# Patient Record
Sex: Female | Born: 1985 | Race: White | Hispanic: No | Marital: Single | Smoking: Never smoker
Health system: Southern US, Community
[De-identification: ages and names within clinical notes are randomized; demographics above are authoritative.]

## PROBLEM LIST (undated history)

## (undated) DIAGNOSIS — Z8719 Personal history of other diseases of the digestive system: Secondary | ICD-10-CM

## (undated) DIAGNOSIS — S83511A Sprain of anterior cruciate ligament of right knee, initial encounter: Secondary | ICD-10-CM

## (undated) DIAGNOSIS — Z9989 Dependence on other enabling machines and devices: Secondary | ICD-10-CM

## (undated) DIAGNOSIS — F419 Anxiety disorder, unspecified: Secondary | ICD-10-CM

## (undated) DIAGNOSIS — S83206A Unspecified tear of unspecified meniscus, current injury, right knee, initial encounter: Secondary | ICD-10-CM

## (undated) DIAGNOSIS — K589 Irritable bowel syndrome without diarrhea: Secondary | ICD-10-CM

## (undated) DIAGNOSIS — G4733 Obstructive sleep apnea (adult) (pediatric): Secondary | ICD-10-CM

## (undated) DIAGNOSIS — F32A Depression, unspecified: Secondary | ICD-10-CM

## (undated) DIAGNOSIS — F329 Major depressive disorder, single episode, unspecified: Secondary | ICD-10-CM

## (undated) DIAGNOSIS — E282 Polycystic ovarian syndrome: Secondary | ICD-10-CM

## (undated) DIAGNOSIS — Z87820 Personal history of traumatic brain injury: Secondary | ICD-10-CM

## (undated) HISTORY — DX: Dependence on other enabling machines and devices: Z99.89

## (undated) HISTORY — PX: ABDOMINAL HERNIA REPAIR: SHX539

## (undated) HISTORY — DX: Obstructive sleep apnea (adult) (pediatric): G47.33

## (undated) HISTORY — DX: Polycystic ovarian syndrome: E28.2

## (undated) HISTORY — DX: Irritable bowel syndrome, unspecified: K58.9

## (undated) HISTORY — DX: Personal history of traumatic brain injury: Z87.820

---
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
## 2006-04-05 ENCOUNTER — Emergency Department (HOSPITAL_COMMUNITY): Admission: EM | Admit: 2006-04-05 | Discharge: 2006-04-05 | Payer: Self-pay | Admitting: Emergency Medicine

## 2012-02-21 HISTORY — PX: ESOPHAGOGASTRODUODENOSCOPY: SHX1529

## 2013-06-23 ENCOUNTER — Other Ambulatory Visit: Payer: Self-pay | Admitting: Family Medicine

## 2013-06-23 DIAGNOSIS — M25561 Pain in right knee: Secondary | ICD-10-CM

## 2013-06-28 ENCOUNTER — Ambulatory Visit
Admission: RE | Admit: 2013-06-28 | Discharge: 2013-06-28 | Disposition: A | Discharge status: Home / Self Care | Payer: 59 | Source: Ambulatory Visit | Attending: Family Medicine | Admitting: Family Medicine

## 2013-06-28 DIAGNOSIS — M25561 Pain in right knee: Secondary | ICD-10-CM

## 2013-07-30 ENCOUNTER — Encounter (HOSPITAL_BASED_OUTPATIENT_CLINIC_OR_DEPARTMENT_OTHER): Payer: Self-pay | Admitting: *Deleted

## 2013-08-01 ENCOUNTER — Encounter (HOSPITAL_BASED_OUTPATIENT_CLINIC_OR_DEPARTMENT_OTHER): Payer: Self-pay | Admitting: *Deleted

## 2013-08-01 NOTE — Progress Notes (Signed)
NPO AFTER MN WITH EXCEPTION CLEAR LIQUIDS UNTIL 0800 (NO CREAM/ MILK PRODUCTS).  ARRIVE AT 1200. NEEDS HG AND URINE PREG. WILL TAKE AM MEDS AND IF NEEDED TAKE TYLENOL W/ SIPS OF WATER. 

## 2013-08-06 ENCOUNTER — Other Ambulatory Visit: Payer: Self-pay | Admitting: Orthopedic Surgery

## 2013-08-07 ENCOUNTER — Ambulatory Visit (HOSPITAL_BASED_OUTPATIENT_CLINIC_OR_DEPARTMENT_OTHER)
Admission: RE | Admit: 2013-08-07 | Discharge: 2013-08-07 | Disposition: A | Discharge status: Home / Self Care | Payer: 59 | Source: Ambulatory Visit | Attending: Specialist | Admitting: Specialist

## 2013-08-07 ENCOUNTER — Encounter (HOSPITAL_BASED_OUTPATIENT_CLINIC_OR_DEPARTMENT_OTHER)
Admission: RE | Disposition: A | Discharge status: Home / Self Care | Payer: Self-pay | Source: Ambulatory Visit | Attending: Specialist

## 2013-08-07 ENCOUNTER — Ambulatory Visit (HOSPITAL_BASED_OUTPATIENT_CLINIC_OR_DEPARTMENT_OTHER): Payer: 59 | Admitting: Anesthesiology

## 2013-08-07 ENCOUNTER — Encounter (HOSPITAL_BASED_OUTPATIENT_CLINIC_OR_DEPARTMENT_OTHER): Payer: 59 | Admitting: Anesthesiology

## 2013-08-07 ENCOUNTER — Encounter (HOSPITAL_BASED_OUTPATIENT_CLINIC_OR_DEPARTMENT_OTHER): Payer: Self-pay | Admitting: *Deleted

## 2013-08-07 DIAGNOSIS — Z9889 Other specified postprocedural states: Secondary | ICD-10-CM

## 2013-08-07 DIAGNOSIS — X58XXXA Exposure to other specified factors, initial encounter: Secondary | ICD-10-CM | POA: Insufficient documentation

## 2013-08-07 DIAGNOSIS — Y9379 Activity, other specified sports and athletics: Secondary | ICD-10-CM | POA: Insufficient documentation

## 2013-08-07 DIAGNOSIS — Z880 Allergy status to penicillin: Secondary | ICD-10-CM | POA: Insufficient documentation

## 2013-08-07 DIAGNOSIS — Z888 Allergy status to other drugs, medicaments and biological substances status: Secondary | ICD-10-CM | POA: Insufficient documentation

## 2013-08-07 DIAGNOSIS — S83289A Other tear of lateral meniscus, current injury, unspecified knee, initial encounter: Secondary | ICD-10-CM | POA: Insufficient documentation

## 2013-08-07 DIAGNOSIS — F3289 Other specified depressive episodes: Secondary | ICD-10-CM | POA: Insufficient documentation

## 2013-08-07 DIAGNOSIS — Z79899 Other long term (current) drug therapy: Secondary | ICD-10-CM | POA: Insufficient documentation

## 2013-08-07 DIAGNOSIS — S83509A Sprain of unspecified cruciate ligament of unspecified knee, initial encounter: Secondary | ICD-10-CM | POA: Insufficient documentation

## 2013-08-07 DIAGNOSIS — F411 Generalized anxiety disorder: Secondary | ICD-10-CM | POA: Insufficient documentation

## 2013-08-07 DIAGNOSIS — F329 Major depressive disorder, single episode, unspecified: Secondary | ICD-10-CM | POA: Insufficient documentation

## 2013-08-07 HISTORY — DX: Depression, unspecified: F32.A

## 2013-08-07 HISTORY — DX: Anxiety disorder, unspecified: F41.9

## 2013-08-07 HISTORY — DX: Major depressive disorder, single episode, unspecified: F32.9

## 2013-08-07 HISTORY — DX: Personal history of other diseases of the digestive system: Z87.19

## 2013-08-07 HISTORY — DX: Unspecified tear of unspecified meniscus, current injury, right knee, initial encounter: S83.206A

## 2013-08-07 HISTORY — PX: KNEE ARTHROSCOPY WITH ANTERIOR CRUCIATE LIGAMENT (ACL) REPAIR WITH HAMSTRING GRAFT: SHX5645

## 2013-08-07 HISTORY — DX: Sprain of anterior cruciate ligament of right knee, initial encounter: S83.511A

## 2013-08-07 LAB — POCT PREGNANCY, URINE: PREG TEST UR: NEGATIVE

## 2013-08-07 LAB — POCT HEMOGLOBIN-HEMACUE: Hemoglobin: 12.1 g/dL (ref 12.0–15.0)

## 2013-08-07 SURGERY — KNEE ARTHROSCOPY WITH ANTERIOR CRUCIATE LIGAMENT (ACL) REPAIR WITH HAMSTRING GRAFT
Anesthesia: Regional | Site: Knee | Laterality: Right

## 2013-08-07 MED ORDER — ONDANSETRON HCL 4 MG/2ML IJ SOLN
INTRAMUSCULAR | Status: DC | PRN
  Administered 2013-08-07: 4 mg via INTRAVENOUS

## 2013-08-07 MED ORDER — LACTATED RINGERS IV SOLN
INTRAVENOUS | Status: DC | PRN
  Administered 2013-08-07 (×2): via INTRAVENOUS

## 2013-08-07 MED ORDER — ASPIRIN EC 325 MG PO TBEC: 325.0000 mg | DELAYED_RELEASE_TABLET | Freq: Two times a day (BID) | ORAL | Status: DC

## 2013-08-07 MED ORDER — LACTATED RINGERS IV SOLN
INTRAVENOUS | Status: DC
  Administered 2013-08-07 (×2): via INTRAVENOUS
  Filled 2013-08-07: qty 1000

## 2013-08-07 MED ORDER — OXYCODONE HCL 5 MG PO TABS
5.0000 mg | ORAL_TABLET | Freq: Once | ORAL | Status: AC | PRN
  Administered 2013-08-07: 5 mg via ORAL
  Filled 2013-08-07: qty 1

## 2013-08-07 MED ORDER — METHOCARBAMOL 500 MG PO TABS: 500.0000 mg | ORAL_TABLET | Freq: Four times a day (QID) | ORAL | Status: DC | PRN

## 2013-08-07 MED ORDER — PROPOFOL 10 MG/ML IV BOLUS
INTRAVENOUS | Status: DC | PRN
  Administered 2013-08-07: 200 mg via INTRAVENOUS

## 2013-08-07 MED ORDER — FENTANYL CITRATE 0.05 MG/ML IJ SOLN
100.0000 ug | Freq: Once | INTRAMUSCULAR | Status: AC
  Administered 2013-08-07: 100 ug via INTRAVENOUS
  Filled 2013-08-07: qty 2

## 2013-08-07 MED ORDER — MIDAZOLAM HCL 2 MG/2ML IJ SOLN
2.0000 mg | Freq: Once | INTRAMUSCULAR | Status: AC
  Administered 2013-08-07: 2 mg via INTRAVENOUS
  Filled 2013-08-07: qty 2

## 2013-08-07 MED ORDER — HYDROMORPHONE HCL PF 1 MG/ML IJ SOLN
0.2500 mg | INTRAMUSCULAR | Status: DC | PRN
  Filled 2013-08-07: qty 1

## 2013-08-07 MED ORDER — DEXAMETHASONE SODIUM PHOSPHATE 4 MG/ML IJ SOLN
INTRAMUSCULAR | Status: DC | PRN
  Administered 2013-08-07: 10 mg via INTRAVENOUS

## 2013-08-07 MED ORDER — METOCLOPRAMIDE HCL 5 MG/ML IJ SOLN
INTRAMUSCULAR | Status: DC | PRN
  Administered 2013-08-07: 10 mg via INTRAVENOUS

## 2013-08-07 MED ORDER — SODIUM CHLORIDE 0.9 % IR SOLN
Status: DC | PRN
  Administered 2013-08-07: 18000 mL

## 2013-08-07 MED ORDER — FENTANYL CITRATE 0.05 MG/ML IJ SOLN
INTRAMUSCULAR | Status: DC | PRN
  Administered 2013-08-07 (×12): 25 ug via INTRAVENOUS

## 2013-08-07 MED ORDER — MIDAZOLAM HCL 2 MG/2ML IJ SOLN
INTRAMUSCULAR | Status: AC
  Filled 2013-08-07: qty 2

## 2013-08-07 MED ORDER — KETOROLAC TROMETHAMINE 30 MG/ML IJ SOLN
INTRAMUSCULAR | Status: DC | PRN
  Administered 2013-08-07: 30 mg via INTRAVENOUS

## 2013-08-07 MED ORDER — OXYCODONE HCL 5 MG/5ML PO SOLN
5.0000 mg | Freq: Once | ORAL | Status: AC | PRN
  Filled 2013-08-07: qty 5

## 2013-08-07 MED ORDER — MORPHINE SULFATE 4 MG/ML IJ SOLN
INTRAMUSCULAR | Status: DC | PRN
  Administered 2013-08-07: 4 mg via INTRAVENOUS

## 2013-08-07 MED ORDER — LIDOCAINE HCL (CARDIAC) 20 MG/ML IV SOLN
INTRAVENOUS | Status: DC | PRN
  Administered 2013-08-07: 60 mg via INTRAVENOUS

## 2013-08-07 MED ORDER — CEFAZOLIN SODIUM-DEXTROSE 2-3 GM-% IV SOLR
2.0000 g | INTRAVENOUS | Status: AC
  Administered 2013-08-07: 2 g via INTRAVENOUS
  Filled 2013-08-07: qty 50

## 2013-08-07 MED ORDER — PROMETHAZINE HCL 25 MG/ML IJ SOLN
INTRAMUSCULAR | Status: AC
  Filled 2013-08-07: qty 1

## 2013-08-07 MED ORDER — ONDANSETRON HCL 4 MG/2ML IJ SOLN: INTRAMUSCULAR | Status: DC | PRN

## 2013-08-07 MED ORDER — BUPIVACAINE HCL (PF) 0.25 % IJ SOLN
INTRAMUSCULAR | Status: DC | PRN
  Administered 2013-08-07: 30 mL

## 2013-08-07 MED ORDER — FENTANYL CITRATE 0.05 MG/ML IJ SOLN
INTRAMUSCULAR | Status: AC
  Filled 2013-08-07: qty 2

## 2013-08-07 MED ORDER — MIDAZOLAM HCL 5 MG/5ML IJ SOLN
INTRAMUSCULAR | Status: DC | PRN
  Administered 2013-08-07: 0.5 mg via INTRAVENOUS
  Administered 2013-08-07: 1 mg via INTRAVENOUS
  Administered 2013-08-07: 0.5 mg via INTRAVENOUS

## 2013-08-07 MED ORDER — SODIUM CHLORIDE 0.9 % IV SOLN
INTRAVENOUS | Status: DC
Start: 2013-08-07 — End: 2013-08-07
  Filled 2013-08-07: qty 1000

## 2013-08-07 MED ORDER — ACETAMINOPHEN 10 MG/ML IV SOLN
INTRAVENOUS | Status: DC | PRN
  Administered 2013-08-07: 1000 mg via INTRAVENOUS

## 2013-08-07 MED ORDER — POVIDONE-IODINE 7.5 % EX SOLN
Freq: Once | CUTANEOUS | Status: DC
  Filled 2013-08-07: qty 118

## 2013-08-07 MED ORDER — OXYCODONE HCL 5 MG PO TABS
ORAL_TABLET | ORAL | Status: AC
  Filled 2013-08-07: qty 1

## 2013-08-07 MED ORDER — PROMETHAZINE HCL 25 MG/ML IJ SOLN
6.2500 mg | INTRAMUSCULAR | Status: DC | PRN
  Administered 2013-08-07: 6.25 mg via INTRAVENOUS
  Filled 2013-08-07: qty 1

## 2013-08-07 MED ORDER — FENTANYL CITRATE 0.05 MG/ML IJ SOLN
INTRAMUSCULAR | Status: AC
  Filled 2013-08-07: qty 6

## 2013-08-07 MED ORDER — CEPHALEXIN 500 MG PO CAPS: 500.0000 mg | ORAL_CAPSULE | Freq: Three times a day (TID) | ORAL | Status: DC

## 2013-08-07 MED ORDER — OXYCODONE-ACETAMINOPHEN 5-325 MG PO TABS: 1.0000 | ORAL_TABLET | ORAL | Status: DC | PRN

## 2013-08-07 MED ORDER — MEPERIDINE HCL 25 MG/ML IJ SOLN
6.2500 mg | INTRAMUSCULAR | Status: DC | PRN
  Filled 2013-08-07: qty 1

## 2013-08-07 MED ORDER — MORPHINE SULFATE 4 MG/ML IJ SOLN
INTRAMUSCULAR | Status: AC
  Filled 2013-08-07: qty 1

## 2013-08-07 SURGICAL SUPPLY — 86 items
ANCHOR BUTTON TIGHTROPE ACL RT (Orthopedic Implant) ×3 IMPLANT
ANCHOR PUSHLOCK BIOCOMP 3.5X19 (Orthopedic Implant) ×3 IMPLANT
BANDAGE ESMARK 6X9 LF (GAUZE/BANDAGES/DRESSINGS) ×1 IMPLANT
BANDAGE GAUZE ELAST BULKY 4 IN (GAUZE/BANDAGES/DRESSINGS) ×3 IMPLANT
BLADE 4.2CUDA (BLADE) IMPLANT
BLADE CUDA 5.5 (BLADE) ×3 IMPLANT
BLADE CUDA GRT WHITE 3.5 (BLADE) ×3 IMPLANT
BLADE SURG 10 STRL SS (BLADE) ×3 IMPLANT
BLADE SURG 15 STRL LF DISP TIS (BLADE) ×1 IMPLANT
BLADE SURG 15 STRL SS (BLADE) ×2
BNDG CMPR 9X6 STRL LF SNTH (GAUZE/BANDAGES/DRESSINGS) ×1
BNDG ESMARK 6X9 LF (GAUZE/BANDAGES/DRESSINGS) ×3
BNDG GAUZE ELAST 4 BULKY (GAUZE/BANDAGES/DRESSINGS) ×6 IMPLANT
BUR OVAL 6.0 (BURR) IMPLANT
BUR VERTEX HOODED 4.5 (BURR) ×3 IMPLANT
CANISTER SUCT LVC 12 LTR MEDI- (MISCELLANEOUS) ×6 IMPLANT
CANISTER SUCTION 1200CC (MISCELLANEOUS) IMPLANT
CANISTER SUCTION 2500CC (MISCELLANEOUS) ×6 IMPLANT
CLOSURE WOUND 1/2 X4 (GAUZE/BANDAGES/DRESSINGS) ×1
CLOTH BEACON ORANGE TIMEOUT ST (SAFETY) IMPLANT
COVER TABLE BACK 60X90 (DRAPES) ×3 IMPLANT
CUFF TOURN SGL QUICK 34 (TOURNIQUET CUFF) ×2
CUFF TRNQT CYL 34X4X40X1 (TOURNIQUET CUFF) ×1 IMPLANT
DRAPE ARTHROSCOPY W/POUCH 114 (DRAPES) ×3 IMPLANT
DRAPE INCISE IOBAN 66X45 STRL (DRAPES) ×3 IMPLANT
DRAPE LG THREE QUARTER DISP (DRAPES) ×9 IMPLANT
DRAPE OEC MINIVIEW 54X84 (DRAPES) ×3 IMPLANT
DRAPE U-SHAPE 47X51 STRL (DRAPES) ×3 IMPLANT
DRILL FLIPCUTTER II 8.5MM (INSTRUMENTS) ×1 IMPLANT
DRILL FLIPCUTTER II 9.0MM (INSTRUMENTS) ×1 IMPLANT
DURAPREP 26ML APPLICATOR (WOUND CARE) ×3 IMPLANT
ELECT REM PT RETURN 9FT ADLT (ELECTROSURGICAL) ×3
ELECTRODE REM PT RTRN 9FT ADLT (ELECTROSURGICAL) ×1 IMPLANT
FIBERSTICK 2 (SUTURE) ×3 IMPLANT
FLIPCUTTER II 8.5MM (INSTRUMENTS) ×3
FLIPCUTTER II 9.0MM (INSTRUMENTS) ×3
GAUZE XEROFORM 1X8 LF (GAUZE/BANDAGES/DRESSINGS) ×3 IMPLANT
GLOVE BIO SURGEON STRL SZ7.5 (GLOVE) ×3 IMPLANT
GLOVE BIOGEL PI IND STRL 7.5 (GLOVE) ×2 IMPLANT
GLOVE BIOGEL PI INDICATOR 7.5 (GLOVE) ×4
GLOVE INDICATOR 8.0 STRL GRN (GLOVE) ×3 IMPLANT
GLOVE SURG ORTHO 8.0 STRL STRW (GLOVE) ×3 IMPLANT
GLOVE SURG SS PI 7.5 STRL IVOR (GLOVE) ×3 IMPLANT
GOWN PREVENTION PLUS LG XLONG (DISPOSABLE) IMPLANT
GOWN STRL REIN XL XLG (GOWN DISPOSABLE) IMPLANT
GOWN STRL REUS W/TWL XL LVL3 (GOWN DISPOSABLE) ×9 IMPLANT
IMMOBILIZER KNEE 22 UNIV (SOFTGOODS) IMPLANT
IV NS IRRIG 3000ML ARTHROMATIC (IV SOLUTION) ×18 IMPLANT
KIT BUTTON TIGHTROPE ABS 8X12 (Anchor) ×3 IMPLANT
KIT RETRO BUTTON TIGHTROPE ABS (Anchor) ×3 IMPLANT
KIT TRANSTIBIAL (DISPOSABLE) IMPLANT
KNEE WRAP E Z 3 GEL PACK (MISCELLANEOUS) ×3 IMPLANT
MINI VAC (SURGICAL WAND) IMPLANT
NEEDLE HYPO 22GX1.5 SAFETY (NEEDLE) IMPLANT
PACK ARTHROSCOPY DSU (CUSTOM PROCEDURE TRAY) ×3 IMPLANT
PACK BASIN DAY SURGERY FS (CUSTOM PROCEDURE TRAY) ×3 IMPLANT
PAD ABD 8X10 STRL (GAUZE/BANDAGES/DRESSINGS) ×3 IMPLANT
PADDING CAST ABS 4INX4YD NS (CAST SUPPLIES) ×2
PADDING CAST ABS COTTON 4X4 ST (CAST SUPPLIES) ×1 IMPLANT
PENCIL BUTTON HOLSTER BLD 10FT (ELECTRODE) ×3 IMPLANT
SET ARTHROSCOPY TUBING (MISCELLANEOUS) ×2
SET ARTHROSCOPY TUBING LN (MISCELLANEOUS) ×1 IMPLANT
SET PAD KNEE POSITIONER (MISCELLANEOUS) ×3 IMPLANT
SPONGE GAUZE 4X4 12PLY (GAUZE/BANDAGES/DRESSINGS) ×3 IMPLANT
SPONGE LAP 4X18 X RAY DECT (DISPOSABLE) ×3 IMPLANT
STRIP CLOSURE SKIN 1/2X4 (GAUZE/BANDAGES/DRESSINGS) ×2 IMPLANT
SUCTION FRAZIER TIP 10 FR DISP (SUCTIONS) ×3 IMPLANT
SUT 2 FIBERLOOP 20 STRT BLUE (SUTURE) ×6
SUT ETHILON 4 0 PS 2 18 (SUTURE) ×6 IMPLANT
SUT FIBERWIRE #2 38 T-5 BLUE (SUTURE)
SUT MNCRL AB 3-0 PS2 18 (SUTURE) IMPLANT
SUT VIC AB 0 CT1 36 (SUTURE) IMPLANT
SUT VIC AB 0 CT2 27 (SUTURE) ×9 IMPLANT
SUT VIC AB 2-0 CT1 27 (SUTURE) ×2
SUT VIC AB 2-0 CT1 TAPERPNT 27 (SUTURE) ×1 IMPLANT
SUTURE 2 FIBERLOOP 20 STRT BLU (SUTURE) ×2 IMPLANT
SUTURE FIBERWR #2 38 T-5 BLUE (SUTURE) IMPLANT
SUTURE TIGERSTICK 2 TIGERWIR 2 (MISCELLANEOUS) IMPLANT
SYR CONTROL 10ML LL (SYRINGE) IMPLANT
TIGERSTICK 2 TIGERWIRE 2 (MISCELLANEOUS)
TOWEL OR 17X24 6PK STRL BLUE (TOWEL DISPOSABLE) ×3 IMPLANT
TUBE CONNECTING 12'X1/4 (SUCTIONS) ×2
TUBE CONNECTING 12X1/4 (SUCTIONS) ×4 IMPLANT
WAND 30 DEG SABER W/CORD (SURGICAL WAND) IMPLANT
WAND 90 DEG TURBOVAC W/CORD (SURGICAL WAND) IMPLANT
WATER STERILE IRR 500ML POUR (IV SOLUTION) ×3 IMPLANT

## 2013-08-07 NOTE — Discharge Instructions (Signed)
°  Post Anesthesia Home Care Instructions ° °Activity: °Get plenty of rest for the remainder of the day. A responsible adult should stay with you for 24 hours following the procedure.  °For the next 24 hours, DO NOT: °-Drive a car °-Operate machinery °-Drink alcoholic beverages °-Take any medication unless instructed by your physician °-Make any legal decisions or sign important papers. ° °Meals: °Start with liquid foods such as gelatin or soup. Progress to regular foods as tolerated. Avoid greasy, spicy, heavy foods. If nausea and/or vomiting occur, drink only clear liquids until the nausea and/or vomiting subsides. Call your physician if vomiting continues. ° °Special Instructions/Symptoms: °Your throat may feel dry or sore from the anesthesia or the breathing tube placed in your throat during surgery. If this causes discomfort, gargle with warm salt water. The discomfort should disappear within 24 hours. ° °Regional Anesthesia Blocks ° °1. Numbness or the inability to move the "blocked" extremity may last from 3-48 hours after placement. The length of time depends on the medication injected and your individual response to the medication. If the numbness is not going away after 48 hours, call your surgeon. ° °2. The extremity that is blocked will need to be protected until the numbness is gone and the  Strength has returned. Because you cannot feel it, you will need to take extra care to avoid injury. Because it may be weak, you may have difficulty moving it or using it. You may not know what position it is in without looking at it while the block is in effect. ° °3. For blocks in the legs and feet, returning to weight bearing and walking needs to be done carefully. You will need to wait until the numbness is entirely gone and the strength has returned. You should be able to move your leg and foot normally before you try and bear weight or walk. You will need someone to be with you when you first try to ensure you  do not fall and possibly risk injury. ° °4. Bruising and tenderness at the needle site are common side effects and will resolve in a few days. ° °5. Persistent numbness or new problems with movement should be communicated to the surgeon or the [facility] ([phone])/  Surgery Center ([phone]). °

## 2013-08-07 NOTE — H&P (Signed)
[name] LaityJacqueline K Downs is an 28 y.o. female.   Chief Complaint: "Right knee instability and pain for a few months"  HPI: Patient presents with right knee discomfort that had been persistent for several months now. This is related to a kickball incident. Despite conservative treatments, her discomfort has not improved. Imaging was obtained. Other conservative and surgical treatments were discussed in detail. Patient wishes to proceed with surgery as consented. Denies SOB, CP, or calf pain. No Fever, chills, or nausea/ vomiting.   Past Medical History  Diagnosis Date  . Right knee meniscal tear   . Right ACL tear   . H/O hiatal hernia   . Anxiety   . Depression     Past Surgical History  Procedure Laterality Date  . Abdominal hernia repair  age 86  . Esophagogastroduodenoscopy  2014    History reviewed. No pertinent family history. Social History:  reports that she has never smoked. She has never used smokeless tobacco. She reports that she drinks alcohol. She reports that she does not use illicit drugs.  Allergies:  Allergies  Allergen Reactions  . Amoxicillin Hives  . Neosporin [Neomycin-Bacitracin Zn-Polymyx] Other (See Comments)    Blistering reaction  . Penicillins Hives    Medications Prior to Admission  Medication Sig Dispense Refill  . cetirizine (ZYRTEC) 10 MG tablet Take 10 mg by mouth every morning.      Marland Kitchen. ibuprofen (ADVIL,MOTRIN) 200 MG tablet Take 200 mg by mouth every 6 (six) hours as needed.      . Norgestimate-Ethinyl Estradiol Triphasic (ORTHO TRI-CYCLEN LO) 0.18/0.215/0.25 MG-25 MCG tab Take 1 tablet by mouth every morning.      . sertraline (ZOLOFT) 50 MG tablet Take 50 mg by mouth every morning.      Marland Kitchen. acetaminophen (TYLENOL) 500 MG tablet Take 500 mg by mouth every 6 (six) hours as needed.        Results for orders placed during the hospital encounter of 08/07/13 (from the past 48 hour(s))  POCT HEMOGLOBIN-HEMACUE     Status: None   Collection Time   08/07/13 11:51 AM      Result Value Ref Range   Hemoglobin 12.1  12.0 - 15.0 g/dL  POCT PREGNANCY, URINE     Status: None   Collection Time    08/07/13 11:51 AM      Result Value Ref Range   Preg Test, Ur NEGATIVE  NEGATIVE   Comment:            THE SENSITIVITY OF THIS     METHODOLOGY IS >24 mIU/mL   No results found.  Review of Systems  Constitutional: Negative.   HENT: Negative.   Eyes: Negative.   Respiratory: Negative.   Cardiovascular: Negative.   Gastrointestinal: Negative.   Genitourinary: Negative.   Musculoskeletal: Positive for joint pain.  Skin: Negative.   Neurological: Negative.   Endo/Heme/Allergies: Negative.   Psychiatric/Behavioral: Negative.     Blood pressure 111/70, pulse 86, temperature 97.1 F (36.2 C), temperature source Oral, resp. rate 16, height 5' 7.75" (1.721 m), weight 79.379 kg (175 lb), last menstrual period 07/26/2013, SpO2 97.00%. Physical Exam  Constitutional: She is oriented to person, place, and time. She appears well-developed.  HENT:  Head: Normocephalic.  Eyes: EOM are normal.  Neck: Normal range of motion.  Cardiovascular: Normal rate, regular rhythm, normal heart sounds and intact distal pulses.   Respiratory: Effort normal and breath sounds normal.  GI: Soft. Bowel sounds are normal.  Genitourinary:  deferred  Musculoskeletal: She exhibits edema and tenderness.  Right knee. Calf soft and non tender.  Neurological: She is alert and oriented to person, place, and time.  Skin: Skin is warm and dry.  Psychiatric: Her behavior is normal.     Assessment/Plan: Torn ACL and medial/lateral meniscus: Right knee ACL reconstruction autograft and menisectomies. D/c home today Follow Instructions F/U in office in 3-4 days Take medication as directed   STILWELL, BRYSON L 08/07/2013, 1:35 PM

## 2013-08-07 NOTE — Op Note (Signed)
[phone]#[id number]

## 2013-08-07 NOTE — Anesthesia Preprocedure Evaluation (Addendum)
Anesthesia Evaluation  Patient identified by MRN, date of birth, ID band Patient awake    Reviewed: Allergy & Precautions, H&P , NPO status , Patient's Chart, lab work & pertinent test results  Airway Mallampati: I TM Distance: >3 FB Neck ROM: Full    Dental  (+) Dental Advisory Given   Pulmonary neg pulmonary ROS,  breath sounds clear to auscultation        Cardiovascular negative cardio ROS  Rhythm:Regular Rate:Normal     Neuro/Psych negative neurological ROS  negative psych ROS   GI/Hepatic Neg liver ROS, hiatal hernia,   Endo/Other  negative endocrine ROS  Renal/GU negative Renal ROS     Musculoskeletal negative musculoskeletal ROS (+)   Abdominal   Peds  Hematology negative hematology ROS (+)   Anesthesia Other Findings   Reproductive/Obstetrics negative OB ROS                          Anesthesia Physical Anesthesia Plan  ASA: I  Anesthesia Plan: General and Regional   Post-op Pain Management:    Induction: Intravenous  Airway Management Planned: LMA  Additional Equipment:   Intra-op Plan:   Post-operative Plan: Extubation in OR  Informed Consent: I have reviewed the patients History and Physical, chart, labs and discussed the procedure including the risks, benefits and alternatives for the proposed anesthesia with the patient or authorized representative who has indicated his/her understanding and acceptance.   Dental advisory given  Plan Discussed with: CRNA  Anesthesia Plan Comments:         Anesthesia Quick Evaluation

## 2013-08-07 NOTE — Transfer of Care (Signed)
Immediate Anesthesia Transfer of Care Note  Patient: [name] Downs  Procedure(s) Performed: Procedure(s) (LRB): RIGHT ARTHROSCOPY KNEE,AUTOGRAPH HAMSTRING ACL RECONSTRUCTION  (Right)  Patient Location: PACU  Anesthesia Type: General  Level of Consciousness: awake, sedated, patient cooperative and responds to stimulation  Airway & Oxygen Therapy: Patient Spontanous Breathing and Patient connected to face mask oxygen  Post-op Assessment: Report given to PACU RN, Post -op Vital signs reviewed and stable and Patient moving all extremities  Post vital signs: Reviewed and stable  Complications: No apparent anesthesia complications

## 2013-08-07 NOTE — Anesthesia Procedure Notes (Addendum)
Procedure Name: LMA Insertion Date/Time: 08/07/2013 2:23 PM Performed by: Jessica PriestBEESON, LYNN C Pre-anesthesia Checklist: Patient identified, Emergency Drugs available, Suction available and Patient being monitored Patient Re-evaluated:Patient Re-evaluated prior to inductionOxygen Delivery Method: Circle System Utilized Preoxygenation: Pre-oxygenation with 100% oxygen Intubation Type: IV induction Ventilation: Mask ventilation without difficulty LMA: LMA inserted LMA Size: 4.0 Number of attempts: 1 Airway Equipment and Method: bite block Placement Confirmation: positive ETCO2 Tube secured with: Tape Dental Injury: Teeth and Oropharynx as per pre-operative assessment    Anesthesia Regional Block:  Femoral nerve block  Pre-Anesthetic Checklist: ,, timeout performed, Correct Patient, Correct Site, Correct Laterality, Correct Procedure, Correct Position, site marked, Risks and benefits discussed,  Surgical consent,  Pre-op evaluation,  At surgeon's request and post-op pain management  Laterality: Right  Prep: Betadine       Needles:  Injection technique: Single-shot  Needle Type: Stimulator Needle - 80      Needle Gauge: 22 and 22 G  Needle insertion depth: 6 cm   Additional Needles:  Procedures: ultrasound guided (picture in chart) and nerve stimulator Femoral nerve block  Nerve Stimulator or Paresthesia:  Response: Twitch elicited, 0.6 mA,   Additional Responses:   Narrative:   Performed by: Personally  Anesthesiologist: [place], MD  Additional Notes: BP cuff, EKG monitors applied. Sedation begun. Femoral artery palpated for location of nerve. After nerve location via u/s anesthetic injected incrementally, slowly , and after neg aspirations. Tolerated well.

## 2013-08-07 NOTE — Interval H&P Note (Signed)
History and Physical Interval Note:  08/07/2013 2:06 PM  [name] Downs  has presented today for surgery, with the diagnosis of RIGHT KNEE ACL TEAR,MEDIAL AND LATERAL MENISCUS TEAR AND LOOSE BODY  The various methods of treatment have been discussed with the patient and family. After consideration of risks, benefits and other options for treatment, the patient has consented to  Procedure(s): RIGHT ARTHROSCOPY KNEE,AUTOGRAPH HAMSTRING ACL RECONSTRUCTION ,PARTIAL AND MEDIAL LATERAL MENISECTOMY,POSSIBLE LOOSE BODY REMOVAL (Right) as a surgical intervention .  The patient's history has been reviewed, patient examined, no change in status, stable for surgery.  I have reviewed the patient's chart and labs.  Questions were answered to the patient's satisfaction.     COLLINS,ROBERT ANDREW

## 2013-08-07 NOTE — H&P (View-Only) (Signed)
NPO AFTER MN WITH EXCEPTION CLEAR LIQUIDS UNTIL 0800 (NO CREAM/ MILK PRODUCTS).  ARRIVE AT 1200. NEEDS HG AND URINE PREG. WILL TAKE AM MEDS AND IF NEEDED TAKE TYLENOL W/ SIPS OF WATER.

## 2013-08-08 NOTE — Op Note (Signed)
[name], [name]         ACCOUNT NO.:  [id number]  MEDICAL RECORD NO.:  [id number]  LOCATION:                                 FACILITY:  PHYSICIAN:  Erasmo Leventhalobert Andrew Collins, M.D.DATE OF BIRTH:  [birth year]  DATE OF PROCEDURE:  08/07/2013 DATE OF DISCHARGE:                              OPERATIVE REPORT   PREOPERATIVE DIAGNOSES:  Right knee torn anterior cruciate ligament, possible torn medial meniscus, possible torn lateral meniscus, possible loose body.  POSTOPERATIVE DIAGNOSES: 1. Right knee complete rupture of the anterior cruciate ligament. 2. Complex tear of lateral meniscus.  PROCEDURES: 1. Right knee arthroscopic-assisted autograft hamstring anterior     cruciate ligament reconstruction. 2. Partial lateral meniscectomy.  SURGEON:  Erasmo Leventhalobert Andrew Collins, M.D.  ASSISTANT:  Arsenio LoaderBryson Stilwell, PA-C.  ANESTHESIA:  Regional block with general.  ESTIMATED BLOOD LOSS:  Minimal.  DRAINS:  None.  COMPLICATIONS:  None.  TOURNIQUET TIME:  85 minutes at 250 mmHg.  DISPOSITION:  PACU, stable.  OPERATIVE DETAILS:  The patient was counseled in the holding area and the correct site was identified, signed and marked appropriately.  IV was started and the regional block anesthesia was administered per the anesthesiologist.  TED hose applied on uninvolved leg.  On the way to the operating room, IV Ancef was given.  In the OR, placed in supine position under general anesthesia.  __________ properly padded and bumped.  Right lower extremity was elevated, prepped with DuraPrep, and draped in sterile fashion.  Time-out was done and confirmed by all. Exsanguinated with an Esmarch.  Tourniquet was inflated to 250 mmHg. The knee had been examined.  Positive Lachman's, anterior drawer, and pivot shift.  PCL __________ appeared to be stable.  Incision was made anteromedial __________ the skin, subcutaneous tissue, sartorius fascia was opened __________ were identified.  I then  separated the semitendinosus from the __________, leaving it attached and harvested the semitendinosus in standard fashion, protecting neurovascular structures including saphenous nerve.  Sartorius fascia was closed with Vicryl suture.  The graft was taken to the back table __________.  Arthroscopic portals were established posteromedial, inferomedial, inferolateral.  Diagnostic arthroscopy taken.  ACL was completely ruptured __________ PCL.  These were debrided and arthroplasty performed.  Overtop position was confirmed.  Suprapatellar pouch, medial and lateral gutters unremarkable.  __________ normal tracking throughout entire procedure.  Loose bodies were searched for, but none were encountered.  Lateral side was inspected.  There was a complex tear in the posterior and lateral meniscus.  Large pedunculated flap was debrided with motorized shavers and baskets __________ nicely. __________ meniscus was intact.  Medial side was inspected __________ was normal as was on the lateral side.  The medial meniscus was probed, found to be intact.  The knee was flexed to 90 degrees.  The Arthrex femoral guide __________ overtop position.  Small stab wound was made laterally and then Arthrex flip cutter was placed in anatomic ACL footprint.  A retro socket was performed.  FiberWire suture was passed.  The soft tissue debris was removed.  The tunnel was rasped.  On the tibial side, the guide was flipped into anatomic __________ insertion of tibia.  A flip cutter was engaged also in this region.  The retro socket was performed, FiberWire suture was passed.  Debris was removed from both areas, both tunnels were nice and clean.  __________ ACL TightRope system on the femoral side.  The grafts were delivered in a retrograde fashion, and I confirmed the button was in an overtop position in the periosteal cortex, both arthroscopically visual, palpable __________ anterolaterally, confirmed this  __________ the periosteum.  The grafts were noted to be delivered to the knee joint.  The tibia was placed in an antegrade fashion.  __________ degrees flexion, neutral rotation __________ both sides.  The grafts were sequentially locked down and nicely tensioned and it was supplemented distally with a PushLock anchor.  Knee was put through range of motion with excellent orientation of the graft.  Knee was clinically stable.  Graft had excellent orientation and fixation and well fixed on both sides.  C-arm confirmed that the button was in the __________ periosteal cortex of the femur. The joint was irrigated and an arthroscopic equipment was removed.  The portals were closed with 4-0 nylon suture.  __________ incision laterally closed with 5-0 nylon.  Anteromedial incision was closed with subcu Vicryl and the skin with a subcuticular Monocryl suture.  Steri- Strips were applied.  After confirming with the anesthesia, 30 mL of 0.25% Sensorcaine was placed into the soft tissue region and the joint __________ morphine sulfate to supplement the block.  This is after confirming with the anesthesia.  Sterile dressing applied.  TED hose tourniquet was deflated.  Normal circulation to foot and ankle at the end of the case.  __________ ice pack.  The patient tolerated the procedure well with no complications or problems.  She was awakened. She will be stabilized in PACU and discharged in stable condition.  To help with the patient positioning, prepping and draping, technical and surgical assistance throughout the entire case, wound closure, application of dressing, and splint, Mr. Arsenio LoaderBryson Stilwell, PA-C, assistance was needed.          ______________________________ Erasmo Leventhalobert Andrew Collins, M.D.     RAC/MEDQ  D:  08/07/2013  T:  08/07/2013  Job:  [id number]

## 2013-08-10 NOTE — Anesthesia Postprocedure Evaluation (Signed)
Anesthesia Post Note  Patient: [name] LaityJacqueline K Grosch  Procedure(s) Performed: Procedure(s) (LRB): RIGHT ARTHROSCOPY KNEE,AUTOGRAPH HAMSTRING ACL RECONSTRUCTION  (Right)  Anesthesia type: General  Patient location: PACU  Post pain: Pain level controlled  Post assessment: Post-op Vital signs reviewed  Last Vitals:  Filed Vitals:   08/07/13 1900  BP: 102/66  Pulse: 77  Temp: 36.1 C  Resp: 18    Post vital signs: Reviewed  Level of consciousness: sedated  Complications: No apparent anesthesia complications

## 2013-08-12 ENCOUNTER — Encounter (HOSPITAL_BASED_OUTPATIENT_CLINIC_OR_DEPARTMENT_OTHER): Payer: Self-pay | Admitting: Specialist

## 2013-10-13 ENCOUNTER — Encounter (HOSPITAL_COMMUNITY): Payer: Self-pay | Admitting: Emergency Medicine

## 2013-10-13 ENCOUNTER — Emergency Department (HOSPITAL_COMMUNITY)
Admission: EM | Admit: 2013-10-13 | Discharge: 2013-10-13 | Discharge status: Against Medical Advice | Payer: 59 | Attending: Emergency Medicine | Admitting: Emergency Medicine

## 2013-10-13 DIAGNOSIS — R112 Nausea with vomiting, unspecified: Secondary | ICD-10-CM | POA: Insufficient documentation

## 2013-10-13 DIAGNOSIS — R51 Headache: Secondary | ICD-10-CM | POA: Insufficient documentation

## 2013-10-13 DIAGNOSIS — T360X5A Adverse effect of penicillins, initial encounter: Secondary | ICD-10-CM | POA: Insufficient documentation

## 2013-10-13 MED ORDER — ONDANSETRON 4 MG PO TBDP
4.0000 mg | ORAL_TABLET | Freq: Once | ORAL | Status: AC
  Administered 2013-10-13: 4 mg via ORAL
  Filled 2013-10-13: qty 1

## 2013-10-13 NOTE — ED Notes (Signed)
Pt in c/o possible reaction to a medication tonight, pt states she took bactrim tonight and developed n/v with facial pain, noted redness to her face, symptoms are improving at this time, pt has had reactions to penicillin family antibiotics in the past, alert and oriented, no distress

## 2014-03-16 ENCOUNTER — Emergency Department (HOSPITAL_COMMUNITY)
Admission: EM | Admit: 2014-03-16 | Discharge: 2014-03-16 | Disposition: A | Discharge status: Home / Self Care | Payer: 59 | Attending: Emergency Medicine | Admitting: Emergency Medicine

## 2014-03-16 ENCOUNTER — Emergency Department (HOSPITAL_COMMUNITY): Payer: 59

## 2014-03-16 ENCOUNTER — Encounter (HOSPITAL_COMMUNITY): Payer: Self-pay | Admitting: Emergency Medicine

## 2014-03-16 DIAGNOSIS — Z8719 Personal history of other diseases of the digestive system: Secondary | ICD-10-CM | POA: Diagnosis not present

## 2014-03-16 DIAGNOSIS — F329 Major depressive disorder, single episode, unspecified: Secondary | ICD-10-CM | POA: Insufficient documentation

## 2014-03-16 DIAGNOSIS — Z880 Allergy status to penicillin: Secondary | ICD-10-CM | POA: Insufficient documentation

## 2014-03-16 DIAGNOSIS — Z792 Long term (current) use of antibiotics: Secondary | ICD-10-CM | POA: Diagnosis not present

## 2014-03-16 DIAGNOSIS — S199XXA Unspecified injury of neck, initial encounter: Secondary | ICD-10-CM | POA: Insufficient documentation

## 2014-03-16 DIAGNOSIS — Z7982 Long term (current) use of aspirin: Secondary | ICD-10-CM | POA: Diagnosis not present

## 2014-03-16 DIAGNOSIS — Y92410 Unspecified street and highway as the place of occurrence of the external cause: Secondary | ICD-10-CM | POA: Diagnosis not present

## 2014-03-16 DIAGNOSIS — Y9389 Activity, other specified: Secondary | ICD-10-CM | POA: Diagnosis not present

## 2014-03-16 DIAGNOSIS — S060X0A Concussion without loss of consciousness, initial encounter: Secondary | ICD-10-CM | POA: Insufficient documentation

## 2014-03-16 DIAGNOSIS — Y998 Other external cause status: Secondary | ICD-10-CM | POA: Diagnosis not present

## 2014-03-16 DIAGNOSIS — F419 Anxiety disorder, unspecified: Secondary | ICD-10-CM | POA: Insufficient documentation

## 2014-03-16 DIAGNOSIS — S0990XA Unspecified injury of head, initial encounter: Secondary | ICD-10-CM | POA: Diagnosis present

## 2014-03-16 MED ORDER — IBUPROFEN 800 MG PO TABS: 800.0000 mg | ORAL_TABLET | Freq: Three times a day (TID) | ORAL | Status: DC

## 2014-03-16 MED ORDER — METHOCARBAMOL 500 MG PO TABS: 500.0000 mg | ORAL_TABLET | Freq: Four times a day (QID) | ORAL | Status: DC | PRN

## 2014-03-16 NOTE — Discharge Instructions (Signed)
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
## 2015-05-07 ENCOUNTER — Other Ambulatory Visit: Payer: Self-pay | Admitting: Family Medicine

## 2015-05-07 ENCOUNTER — Ambulatory Visit
Admission: RE | Admit: 2015-05-07 | Discharge: 2015-05-07 | Disposition: A | Discharge status: Home / Self Care | Payer: BLUE CROSS/BLUE SHIELD | Source: Ambulatory Visit | Attending: Family Medicine | Admitting: Family Medicine

## 2015-05-07 DIAGNOSIS — R1011 Right upper quadrant pain: Secondary | ICD-10-CM

## 2015-05-07 IMAGING — CT CT ABD-PELV W/ CM
3 of 4 series · 13 of 36 positions shown, 19 images · IV contrast (READICAT/WATER & [ID] ISOVUE 300)
Comparison: None.

CLINICAL DATA: Acute right lower quadrant abdominal pain.

EXAM:
CT ABDOMEN AND PELVIS WITH CONTRAST
TECHNIQUE: Multidetector CT imaging of the abdomen and pelvis was performed
using the standard protocol following bolus administration of
intravenous contrast.
CONTRAST:  100mL [YQ] IOPAMIDOL ([YQ]) INJECTION 61%

[Series 3: abd/pelvis with · axial · 0.81mm/px · z∈[-362,-32]mm · 8 of 86 slices shown, 13 images]
[im 10/86  soft-tissue]
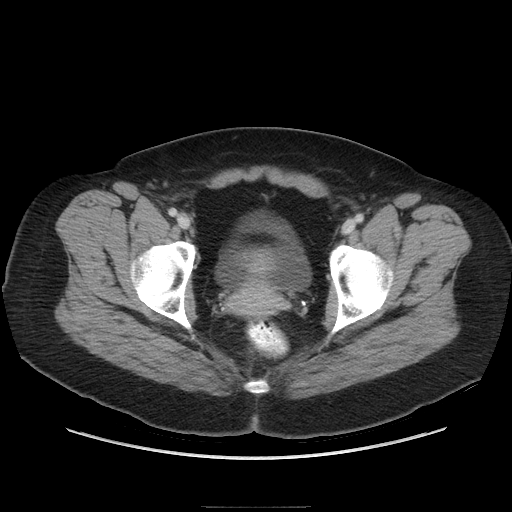
[im 10/86  bone]
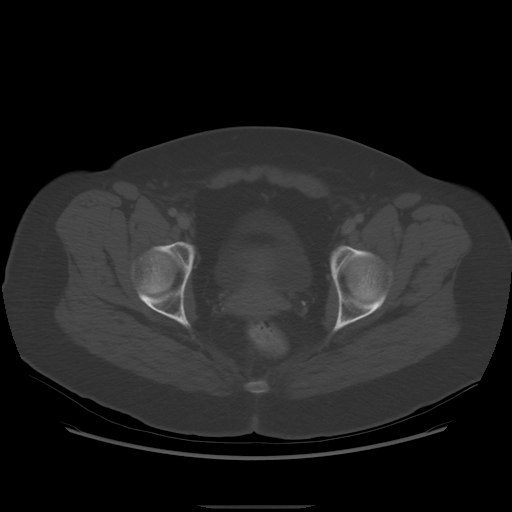
[im 19/86  soft-tissue]
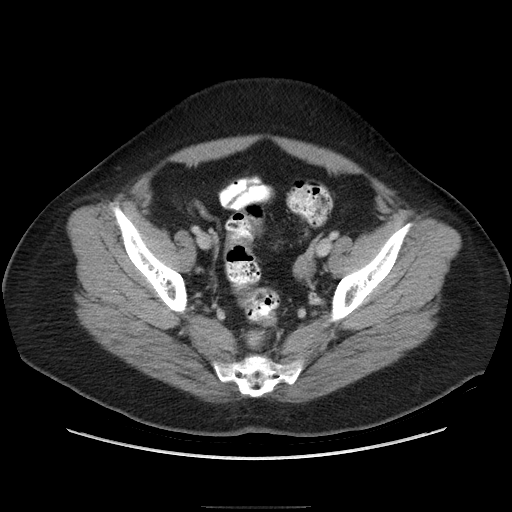
[im 29/86  soft-tissue]
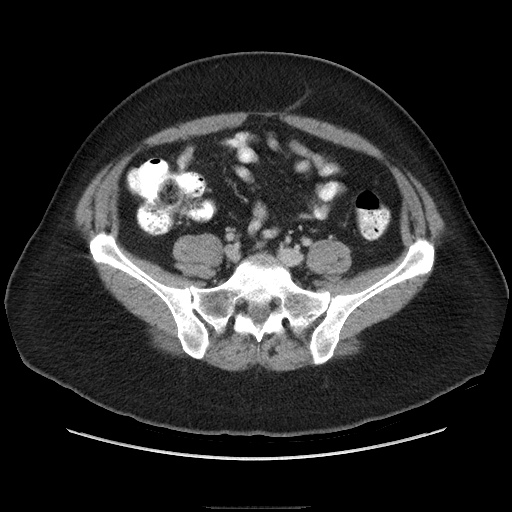
[im 38/86  soft-tissue]
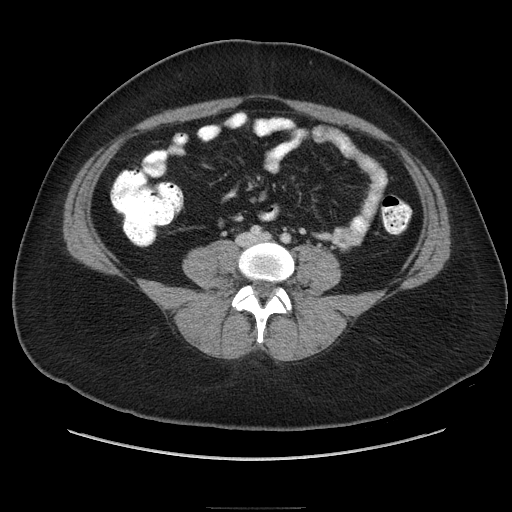
[im 48/86  soft-tissue]
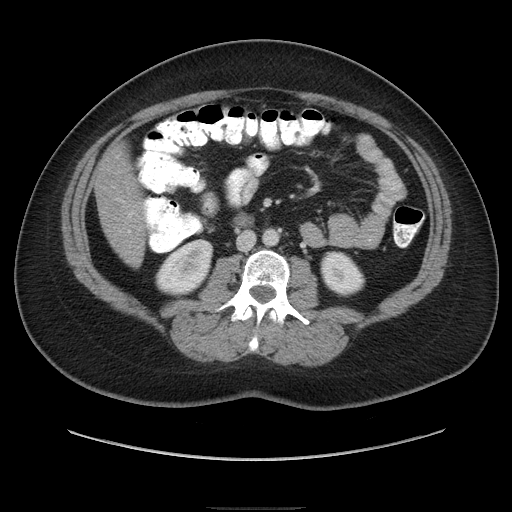
[im 48/86  lung]
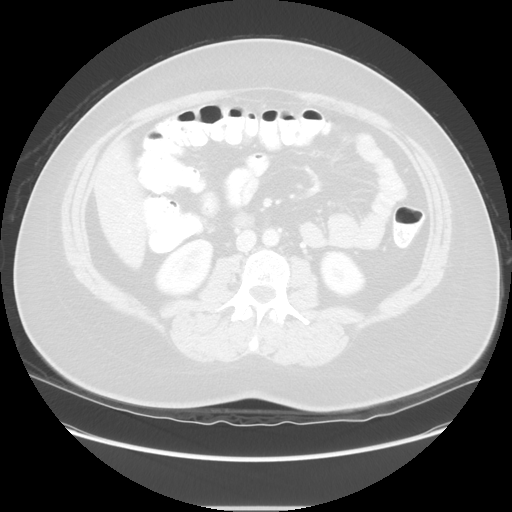
[im 57/86  soft-tissue]
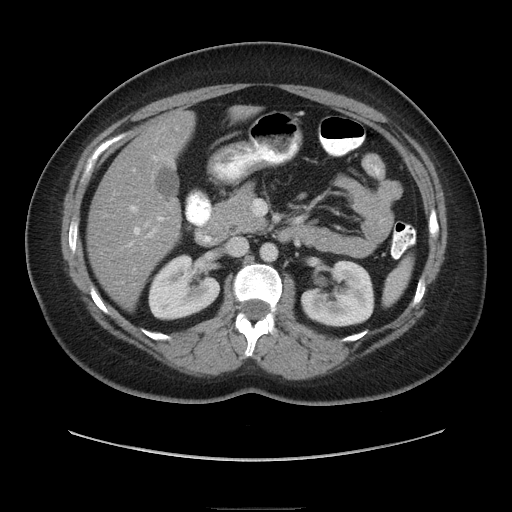
[im 57/86  lung]
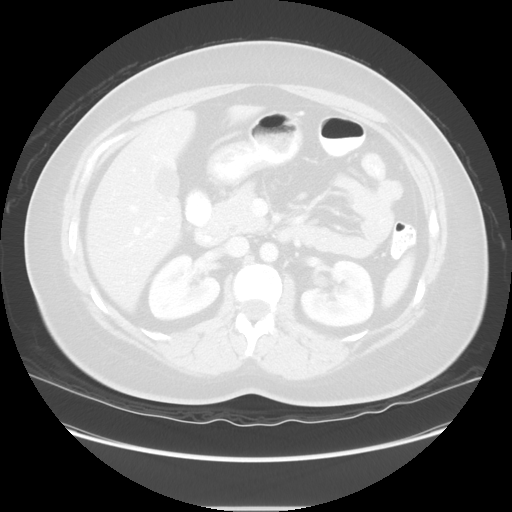
[im 67/86  soft-tissue]
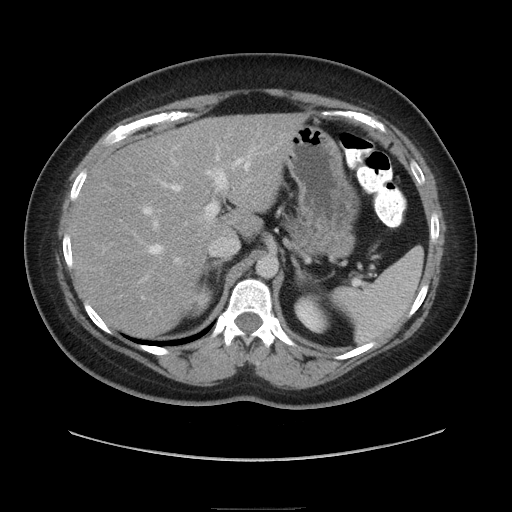
[im 67/86  lung]
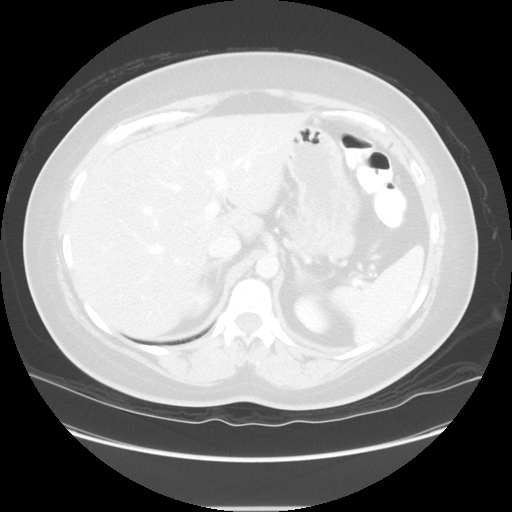
[im 76/86  soft-tissue]
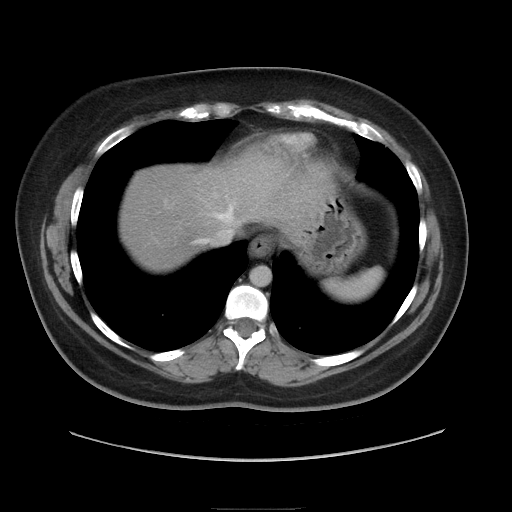
[im 76/86  lung]
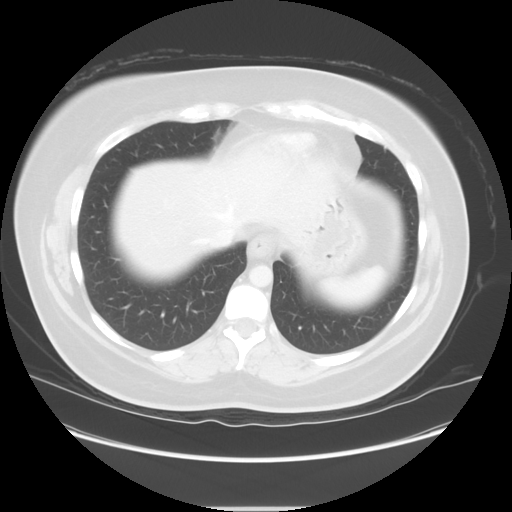

[Series 601: coronal body · coronal · 0.93mm/px · 1 of 134 slices shown, 2 images]
[im 45/134  soft-tissue]
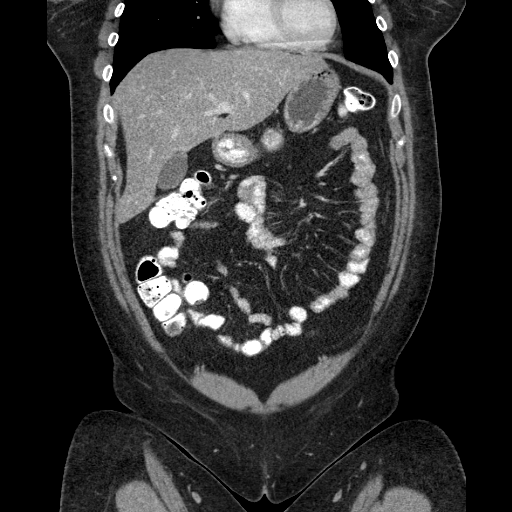
[im 45/134  bone]
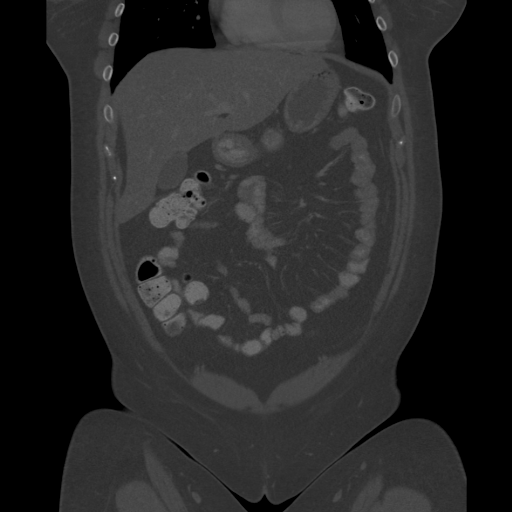

[Series 602: sagittal body · sagittal · 0.93mm/px · 4 of 168 slices shown]
[im 19/168  soft-tissue]
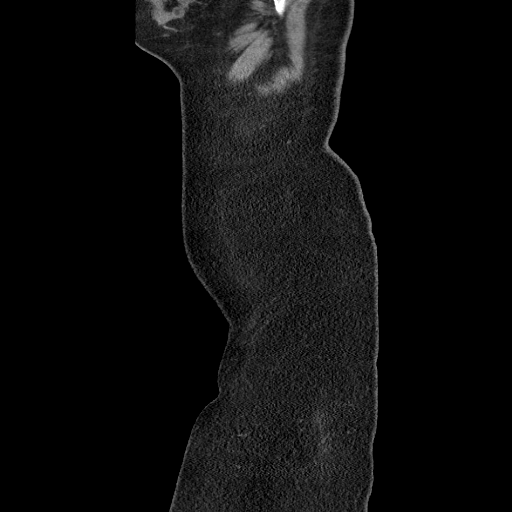
[im 38/168  soft-tissue]
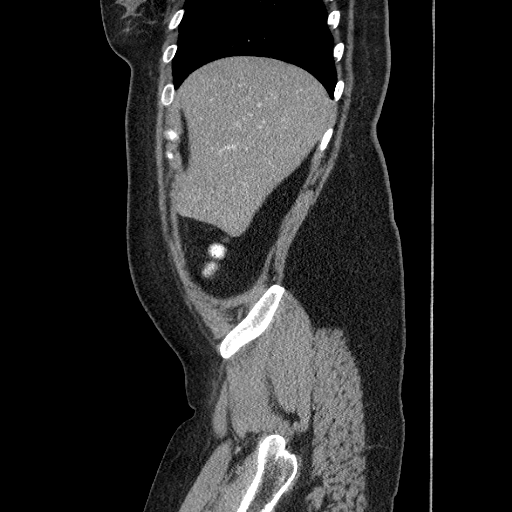
[im 56/168  soft-tissue]
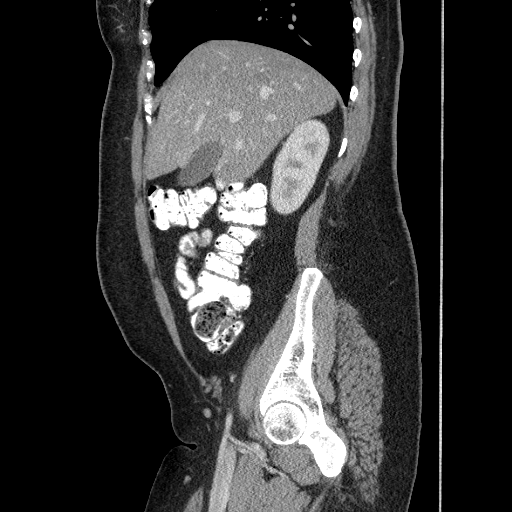
[im 75/168  soft-tissue]
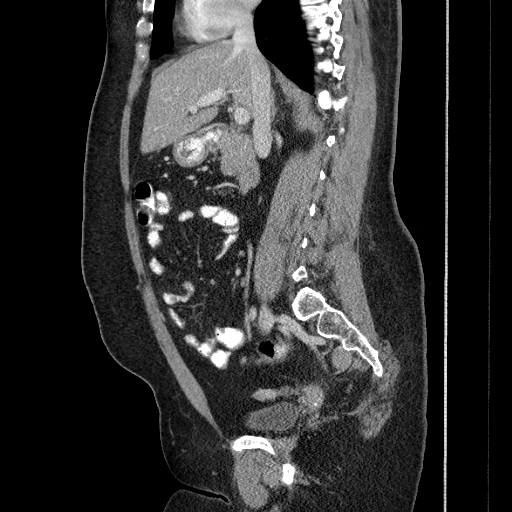

[13 of 36 positions shown; findings below may reference images not displayed]

FINDINGS: Visualized lung bases are unremarkable. No significant osseous
abnormality is noted.

No gallstones are noted. Fatty infiltration of the liver is noted.
The spleen and pancreas are unremarkable. Adrenal glands and kidneys
appear normal. No hydronephrosis or renal obstruction is noted. No
renal or ureteral calculi are noted. The appendix appears normal.
There is no evidence of bowel obstruction. Urinary bladder appears
normal. Uterus and ovaries are unremarkable. No significant
adenopathy is noted.
IMPRESSION: Fatty infiltration of the liver.

The appendix appears normal.  There is no evidence of appendicitis.

No other abnormality seen in the abdomen or pelvis.

## 2015-05-07 MED ORDER — IOPAMIDOL (ISOVUE-300) INJECTION 61%
100.0000 mL | Freq: Once | INTRAVENOUS | Status: AC | PRN
  Administered 2015-05-07: 100 mL via INTRAVENOUS

## 2015-05-25 ENCOUNTER — Other Ambulatory Visit (HOSPITAL_COMMUNITY)
Admission: RE | Admit: 2015-05-25 | Discharge: 2015-05-25 | Disposition: A | Discharge status: Home / Self Care | Payer: BLUE CROSS/BLUE SHIELD | Source: Ambulatory Visit | Attending: Nurse Practitioner | Admitting: Nurse Practitioner

## 2015-05-25 ENCOUNTER — Other Ambulatory Visit: Payer: Self-pay | Admitting: Nurse Practitioner

## 2015-05-25 DIAGNOSIS — Z01419 Encounter for gynecological examination (general) (routine) without abnormal findings: Secondary | ICD-10-CM | POA: Insufficient documentation

## 2015-05-25 DIAGNOSIS — Z1151 Encounter for screening for human papillomavirus (HPV): Secondary | ICD-10-CM | POA: Insufficient documentation

## 2015-05-25 DIAGNOSIS — N760 Acute vaginitis: Secondary | ICD-10-CM | POA: Diagnosis present

## 2015-05-27 LAB — CYTOLOGY - PAP

## 2016-06-13 ENCOUNTER — Ambulatory Visit (INDEPENDENT_AMBULATORY_CARE_PROVIDER_SITE_OTHER): Payer: BLUE CROSS/BLUE SHIELD | Admitting: Neurology

## 2016-06-13 ENCOUNTER — Encounter: Payer: Self-pay | Admitting: Neurology

## 2016-06-13 ENCOUNTER — Encounter (INDEPENDENT_AMBULATORY_CARE_PROVIDER_SITE_OTHER): Payer: Self-pay

## 2016-06-13 VITALS — BP 132/86 | HR 85 | Resp 16 | Ht 68.0 in | Wt 194.0 lb

## 2016-06-13 DIAGNOSIS — R55 Syncope and collapse: Secondary | ICD-10-CM | POA: Diagnosis not present

## 2016-06-13 NOTE — Progress Notes (Signed)
Reason for visit: Near syncope  Referring physician: Dr. Judye Bos is a 31 y.o. female  History of present illness:  Ms. Halabi is a 31 year old right-handed white female with a history of anxiety and a panic disorder. The patient [place] in the morning. The patient did eat breakfast that morning. She had not taken her medications. She suddenly felt lightheaded and then briefly lost vision in both eyes for about 5-10 seconds. The patient did not go off the road or have a motor vehicle accident. The patient indicates that she did not lose consciousness, she was aware of what was going on. She denied any palpitations of her heart. The vision returned, the patient did not have any headache before, during, or after the event. The patient did not feel that she was sleepy around the time of the episode. The patient has not had any further events since this episode. The patient has had some events of feeling lightheaded at times, but no visual loss was noted. The patient has had 2 syncopal events in the past, one as a child when she was sick and she was not eating well. This recurred again when she was sick in college and then had another syncopal episode. The patient denies any numbness or weakness of the extremities, she denies issues controlling the bowels or the bladder, she denies any balance issues. Given the event above, the patient is sent to this office for further evaluation. Routine blood work has been done through the primary care physician, chemistry panel was unremarkable, a CBC was unremarkable with exception of a platelet level of 430. Sedimentation rate was 18, TSH was 1.44.  Past Medical History:  Diagnosis Date  . Anxiety   . Depression   . H/O hiatal hernia   . History of concussion   . IBS (irritable bowel syndrome)   . OSA on CPAP   . Polycystic ovarian syndrome   . Right ACL tear   . Right knee  meniscal tear     Past Surgical History:  Procedure Laterality Date  . ABDOMINAL HERNIA REPAIR  age 84  . ESOPHAGOGASTRODUODENOSCOPY  2014  . KNEE ARTHROSCOPY WITH ANTERIOR CRUCIATE LIGAMENT (ACL) REPAIR WITH HAMSTRING GRAFT Right 08/07/2013   Procedure: RIGHT ARTHROSCOPY KNEE,AUTOGRAPH HAMSTRING ACL RECONSTRUCTION ;  Surgeon: Sydnee Cabal, MD;  Location: [facility];  Service: Orthopedics;  Laterality: Right;    Family History  Problem Relation Age of Onset  . Alcohol abuse Mother   . Hypertension Father   . Alcohol abuse Father   . Parkinson's disease Paternal Grandfather   . Colon cancer Paternal Grandfather     Social history:  reports that she has never smoked. She has never used smokeless tobacco. She reports that she drinks alcohol. She reports that she does not use drugs.  Medications:  Prior to Admission medications   Medication Sig Start Date End Date Taking? Authorizing Provider  cetirizine (ZYRTEC) 10 MG tablet Take by mouth.   Yes Historical Provider, MD  dicyclomine (BENTYL) 10 MG capsule TK 2 CS PO TID PRN 05/19/16  Yes Historical Provider, MD  DULoxetine (CYMBALTA) 60 MG capsule TK 1 C PO QD 05/04/16  Yes Historical Provider, MD  fluticasone (FLONASE) 50 MCG/ACT nasal spray Place into both nostrils daily.   Yes Historical Provider, MD  LORazepam (ATIVAN) 0.5 MG tablet TK 1 T PO TID 06/10/16  Yes Historical Provider, MD  miconazole (  MONISTAT 1 COMBINATION PACK) kit Place 1 each vaginally once.   Yes Historical Provider, MD  NIKKI 3-0.02 MG tablet TK 1 T PO QD 04/28/16  Yes Historical Provider, MD  pantoprazole (PROTONIX) 40 MG tablet TK 1 T PO QAM 05/24/16  Yes Historical Provider, MD      Allergies  Allergen Reactions  . Amoxicillin Hives  . Neosporin [Neomycin-Bacitracin Zn-Polymyx] Other (See Comments)    Blistering reaction  . Oxycodone   . Penicillins Hives    ROS:  Out of a complete 14 system review of symptoms, the patient complains only of  the following symptoms, and all other reviewed systems are negative.  Fatigue Dizziness Blurred vision, loss of vision  Blood pressure 132/86, pulse 85, resp. rate 16, height '5\' 8"'  (1.727 m), weight 194 lb (88 kg).   Blood pressure, right arm, sitting is 132/94. Blood pressure, right arm, standing is 128/90.  Physical Exam  General: The patient is alert and cooperative at the time of the examination. The patient is moderately obese.  Eyes: Pupils are equal, round, and reactive to light. Discs are flat bilaterally.  Neck: The neck is supple, no carotid bruits are noted.  Respiratory: The respiratory examination is clear.  Cardiovascular: The cardiovascular examination reveals a regular rate and rhythm, no obvious murmurs or rubs are noted.  Skin: Extremities are without significant edema.  Neurologic Exam  Mental status: The patient is alert and oriented x 3 at the time of the examination. The patient has apparent normal recent and remote memory, with an apparently normal attention span and concentration ability.  Cranial nerves: Facial symmetry is present. There is good sensation of the face to pinprick and soft touch bilaterally. The strength of the facial muscles and the muscles to head turning and shoulder shrug are normal bilaterally. Speech is well enunciated, no aphasia or dysarthria is noted. Extraocular movements are full. Visual fields are full. The tongue is midline, and the patient has symmetric elevation of the soft palate. No obvious hearing deficits are noted.  Motor: The motor testing reveals 5 over 5 strength of all 4 extremities. Good symmetric motor tone is noted throughout.  Sensory: Sensory testing is intact to pinprick, soft touch, vibration sensation, and position sense on all 4 extremities. No evidence of extinction is noted.  Coordination: Cerebellar testing reveals good finger-nose-finger and heel-to-shin bilaterally.  Gait and station: Gait is normal.  Tandem gait is normal. Romberg is negative. No drift is seen.  Reflexes: Deep tendon reflexes are symmetric and normal bilaterally. Toes are downgoing bilaterally.   Assessment/Plan:  1. Visual loss, probable near-syncope  The clinical examination today is normal. The patient does not appear to be orthostatic on examination today. The patient had an event of visual loss without loss of consciousness that may have represented a near syncopal episode. The patient is not to drive until further notice. She is to undergo MRI of the brain, and a 30 day cardiac monitor study. I will call her with the results of the above evaluation.  Jill Alexanders MD 06/13/2016 7:51 AM  [facility] [address] [place], [place] [address]  Phone [phone] Fax [phone]

## 2016-06-13 NOTE — Patient Instructions (Signed)
   We will check MRI of the brain and get a heart monitor study. Call if any further events are noted.

## 2016-06-14 ENCOUNTER — Ambulatory Visit (INDEPENDENT_AMBULATORY_CARE_PROVIDER_SITE_OTHER): Payer: BLUE CROSS/BLUE SHIELD

## 2016-06-14 DIAGNOSIS — R55 Syncope and collapse: Secondary | ICD-10-CM

## 2016-06-16 ENCOUNTER — Telehealth: Payer: Self-pay | Admitting: Neurology

## 2016-06-16 NOTE — Telephone Encounter (Signed)
  I called the patient. The MRI the brain is normal. The patient is to have a 30 day cardiac monitor study.  MRI brain 06/16/16:  IMPRESSION:  Normal MRI brain (without).

## 2016-06-19 ENCOUNTER — Telehealth: Payer: Self-pay | Admitting: Neurology

## 2016-06-19 NOTE — Telephone Encounter (Signed)
The patient has had an ophthalmologic evaluation that was benign, no etiology for the visual loss noted. This was done by Dr. Wynell Balloon.

## 2016-06-20 ENCOUNTER — Ambulatory Visit (INDEPENDENT_AMBULATORY_CARE_PROVIDER_SITE_OTHER): Payer: BLUE CROSS/BLUE SHIELD

## 2016-06-20 DIAGNOSIS — R55 Syncope and collapse: Secondary | ICD-10-CM | POA: Diagnosis not present

## 2016-06-23 ENCOUNTER — Telehealth: Payer: Self-pay | Admitting: Neurology

## 2016-06-23 NOTE — Telephone Encounter (Signed)
error 

## 2016-07-21 ENCOUNTER — Telehealth: Payer: Self-pay | Admitting: Neurology

## 2016-07-21 NOTE — Telephone Encounter (Addendum)
Called [facility] and verified they received fax request.

## 2016-07-21 NOTE — Telephone Encounter (Signed)
Called and LVM for pt. Advised results not ready yet. We will call her as soon as results ready. Advised office closed. Will open Monday 8-5 if she has further questions.

## 2016-07-21 NOTE — Telephone Encounter (Signed)
Called and spoke with Selena BattenKim in medical records at [facility]- [address]. Requested results of heart monitor faxed to our office. She requested I send request on letter head via fax.  I faxed request to [phone]. Awaiting results.

## 2016-07-21 NOTE — Telephone Encounter (Signed)
Patient called office in reference to receiving results for heart monitor.  Please call

## 2016-07-25 ENCOUNTER — Telehealth: Payer: Self-pay | Admitting: Neurology

## 2016-07-25 NOTE — Telephone Encounter (Signed)
I called patient. The cardiac monitor showed only a 4 beat run of tachycardia. The patient has done fairly well since I have seen her. I would not restrict driving at this point.  30 day cardiac monitor study 07/25/16:  Sinus rhythm with sinus tachycardia 4 beat run of wide complex tachycardia without symptoms noted Dizziness and chest pain associated with sinus rhythm and sinus tachycardia

## 2019-01-22 ENCOUNTER — Other Ambulatory Visit: Payer: Self-pay | Admitting: Nurse Practitioner

## 2019-01-22 ENCOUNTER — Other Ambulatory Visit (HOSPITAL_COMMUNITY)
Admission: RE | Admit: 2019-01-22 | Discharge: 2019-01-22 | Disposition: A | Discharge status: Home / Self Care | Payer: Managed Care, Other (non HMO) | Source: Ambulatory Visit | Attending: Nurse Practitioner | Admitting: Nurse Practitioner

## 2019-01-22 DIAGNOSIS — Z124 Encounter for screening for malignant neoplasm of cervix: Secondary | ICD-10-CM | POA: Diagnosis not present

## 2019-01-28 LAB — CYTOLOGY - PAP
Chlamydia: NEGATIVE
Comment: NEGATIVE
Comment: NEGATIVE
Comment: NORMAL
Diagnosis: NEGATIVE
High risk HPV: NEGATIVE
Neisseria Gonorrhea: NEGATIVE
